# Patient Record
Sex: Female | Born: 1980 | Race: White | Hispanic: No | Marital: Single | Smoking: Never smoker
Health system: Southern US, Community
[De-identification: ages and names within clinical notes are randomized; demographics above are authoritative.]

---
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
## 2009-12-15 ENCOUNTER — Other Ambulatory Visit: Admission: RE | Admit: 2009-12-15 | Discharge: 2009-12-15 | Payer: Self-pay | Admitting: Internal Medicine

## 2011-10-10 DIAGNOSIS — J45990 Exercise induced bronchospasm: Secondary | ICD-10-CM | POA: Insufficient documentation

## 2011-10-10 DIAGNOSIS — J309 Allergic rhinitis, unspecified: Secondary | ICD-10-CM | POA: Insufficient documentation

## 2013-05-01 DIAGNOSIS — M79673 Pain in unspecified foot: Secondary | ICD-10-CM | POA: Insufficient documentation

## 2021-04-11 ENCOUNTER — Other Ambulatory Visit: Payer: Self-pay | Admitting: Internal Medicine

## 2021-04-11 DIAGNOSIS — E7800 Pure hypercholesterolemia, unspecified: Secondary | ICD-10-CM

## 2021-05-30 ENCOUNTER — Ambulatory Visit
Admission: RE | Admit: 2021-05-30 | Discharge: 2021-05-30 | Disposition: A | Discharge status: Home / Self Care | Payer: No Typology Code available for payment source | Source: Ambulatory Visit | Attending: Internal Medicine | Admitting: Internal Medicine

## 2021-05-30 DIAGNOSIS — E7800 Pure hypercholesterolemia, unspecified: Secondary | ICD-10-CM

## 2022-01-31 ENCOUNTER — Ambulatory Visit: Payer: BC Managed Care – PPO | Admitting: Neurology

## 2022-01-31 VITALS — BP 138/92 | HR 73 | Wt 226.0 lb

## 2022-01-31 DIAGNOSIS — R030 Elevated blood-pressure reading, without diagnosis of hypertension: Secondary | ICD-10-CM | POA: Insufficient documentation

## 2022-01-31 DIAGNOSIS — R202 Paresthesia of skin: Secondary | ICD-10-CM | POA: Diagnosis not present

## 2022-01-31 DIAGNOSIS — R52 Pain, unspecified: Secondary | ICD-10-CM | POA: Insufficient documentation

## 2022-01-31 DIAGNOSIS — Z6839 Body mass index (BMI) 39.0-39.9, adult: Secondary | ICD-10-CM | POA: Insufficient documentation

## 2022-01-31 DIAGNOSIS — I2510 Atherosclerotic heart disease of native coronary artery without angina pectoris: Secondary | ICD-10-CM | POA: Insufficient documentation

## 2022-01-31 DIAGNOSIS — R0683 Snoring: Secondary | ICD-10-CM | POA: Diagnosis not present

## 2022-01-31 DIAGNOSIS — K648 Other hemorrhoids: Secondary | ICD-10-CM | POA: Insufficient documentation

## 2022-01-31 DIAGNOSIS — E559 Vitamin D deficiency, unspecified: Secondary | ICD-10-CM | POA: Insufficient documentation

## 2022-01-31 DIAGNOSIS — Z8709 Personal history of other diseases of the respiratory system: Secondary | ICD-10-CM | POA: Insufficient documentation

## 2022-01-31 NOTE — Progress Notes (Signed)
Chief Complaint  Patient presents with   New Patient (Initial Visit)    Rm 15, alone NP/Paper/[facility]/Katie Arlana Pouch NP [phone] onset numbness and tingling in bilateral arms  Sx started 01/26/22 after Bacterial infection, pt woke up 4 nights in row with tingling in both hands        ASSESSMENT AND PLAN  Rachel Blake is a 41 y.o. female   Intermittent bilateral hands paresthesia,  Only happened during sleep, improved with bilateral wrist splint, suggestive bilateral median compression,  Well-preserved sensory examination, brisk reflex, no evidence of demyelinating neuropathy Diffuse body achy pain  Inflammatory markers, CPK, TSH High risk for obstructive sleep apnea  Overweight, narrow long pharyngeal space, excessive daytime fatigue,  Referred for sleep study   DIAGNOSTIC DATA (LABS, IMAGING, TESTING) - I reviewed patient records, labs, notes, testing and imaging myself where available.   MEDICAL HISTORY:  Rachel Blake is a 41 year old female, seen in request by her primary care doctor Merri Brunette, for evaluation of intermittent bilateral hands paresthesia, snoring, initial evaluation was January 31, 2022  I reviewed and summarized the referring note.PMHX  She suffered significant fever, diarrhea following her [place] trip in early August 2023.  Fever for 1 week, diarrhea for about 2 weeks, required 2 rounds of antibiotic treatment, eventually stool sample PCR confirmed the diagnosis of Campylobacter infection, she has lost more than 20 pounds over 2 weeks been  She began to experience intermittent bilateral hands numbness about a week ago, intermittent, only happened she woke up from sleep, improved after she is shake hand, she denies persistent sensory loss, weakness  She was suggested  wrist splint, and she changed her sleeping position to her back, bilateral hand numbness has not occurred, but she continues to feel upper extremity aching fatigue  sensation  She is overweight, noticed to have short neck, narrow long pharyngeal space, reported a long history of snoring, especially if she lying on her back, improved when she sleep on her stomach  She worries about the complications following Campylobacter infection, especially the possibility of Guillain-Barr syndrome, she denies low back pain, no gait abnormality  Laboratory evaluation from [facility] medical associate January 30, 2022, B12 was low normal at 306, normal folic acid 15.2 Normal CBC hemoglobin of 13.6, platelet was elevated 545, BMP showed creatinine 0.77, normal BUN of 10, normal sodium, potassium, chloride, calcium, lipid panel LDL was mildly elevated 142 vitamin D was mildly decreased 27   PHYSICAL EXAM:   Vitals:   01/31/22 1446  BP: (!) 138/92  Pulse: 73  Weight: 226 lb (102.5 kg)     PHYSICAL EXAMNIATION:  Gen: NAD, conversant, well nourised, well groomed                     Cardiovascular: Regular rate rhythm, no peripheral edema, warm, nontender. Eyes: Conjunctivae clear without exudates or hemorrhage Neck: Supple, no carotid bruits. Pulmonary: Clear to auscultation bilaterally   NEUROLOGICAL EXAM:  MENTAL STATUS: Speech/cognition: Awake, alert, oriented to history taking and casual conversation CRANIAL NERVES: CN II: Visual fields are full to confrontation. Pupils are round equal and briskly reactive to light. CN III, IV, VI: extraocular movement are normal. No ptosis. CN V: Facial sensation is intact to light touch CN VII: Face is symmetric with normal eye closure  CN VIII: Hearing is normal to causal conversation. CN IX, X: Phonation is normal. CN XI: Head turning and shoulder shrug are intact CN XII: Narrow oropharyngeal space  MOTOR:  There is no pronator drift of out-stretched arms. Muscle bulk and tone are normal. Muscle strength is normal.  REFLEXES: Reflexes are 2+ and symmetric at the biceps, triceps, knees, and ankles. Plantar  responses are flexor.  SENSORY: Intact to light touch, pinprick and vibratory sensation are intact in fingers and toes.  COORDINATION: There is no trunk or limb dysmetria noted.  GAIT/STANCE: Posture is normal. Gait is steady with normal steps, base, arm swing, and turning. Heel and toe walking are normal. Tandem gait is normal.  Romberg is absent.  REVIEW OF SYSTEMS:  Full 14 system review of systems performed and notable only for as above All other review of systems were negative.   ALLERGIES: No Known Allergies  HOME MEDICATIONS: Current Outpatient Medications  Medication Sig Dispense Refill   Fexofenadine HCl (ALLEGRA PO)      Probiotic Product (PROBIOTIC-10 PO) Take by mouth.     rosuvastatin (CRESTOR) 20 MG tablet 1 tablet     Vitamin D, Ergocalciferol, (DRISDOL) 1.25 MG (50000 UNIT) CAPS capsule Take by mouth.     No current facility-administered medications for this visit.    PAST MEDICAL HISTORY: No past medical history on file.  PAST SURGICAL HISTORY: None FAMILY HISTORY: No family history on file.  SOCIAL HISTORY: Social History   Socioeconomic History   Marital status: Single    Spouse name: Not on file   Number of children: Not on file   Years of education: Not on file   Highest education level: Not on file  Occupational History   Not on file  Tobacco Use   Smoking status: Not on file   Smokeless tobacco: Not on file  Substance and Sexual Activity   Alcohol use: Not on file   Drug use: Not on file   Sexual activity: Not on file  Other Topics Concern   Not on file  Social History Narrative   Not on file   Social Determinants of Health   Financial Resource Strain: Not on file  Food Insecurity: Not on file  Transportation Needs: Not on file  Physical Activity: Not on file  Stress: Not on file  Social Connections: Not on file  Intimate Partner Violence: Not on file      Levert Feinstein, M.D. Ph.D.  [facility] [address] [place], [place] [address] Ph: [phone] Fax: [phone]  CC:  Linus Galas, NP [address] [place],  [place] [address]  Merri Brunette, MD

## 2022-02-01 LAB — SEDIMENTATION RATE: Sed Rate: 12 mm/hr (ref 0–32)

## 2022-02-01 LAB — CK: Total CK: 56 U/L (ref 32–182)

## 2022-02-01 LAB — TSH: TSH: 0.917 u[IU]/mL (ref 0.450–4.500)

## 2022-02-01 LAB — C-REACTIVE PROTEIN: CRP: 1 mg/L (ref 0–10)

## 2022-02-02 ENCOUNTER — Encounter: Payer: Self-pay | Admitting: Neurology

## 2022-04-19 ENCOUNTER — Institutional Professional Consult (permissible substitution): Payer: BC Managed Care – PPO | Admitting: Neurology

## 2022-08-09 ENCOUNTER — Ambulatory Visit: Payer: BC Managed Care – PPO | Admitting: Neurology

## 2022-09-27 ENCOUNTER — Ambulatory Visit: Payer: BC Managed Care – PPO | Admitting: Neurology

## 2022-09-27 ENCOUNTER — Encounter: Payer: Self-pay | Admitting: Neurology

## 2022-09-27 VITALS — BP 130/88 | HR 77 | Ht 66.5 in | Wt 237.0 lb

## 2022-09-27 DIAGNOSIS — R0683 Snoring: Secondary | ICD-10-CM

## 2022-09-27 DIAGNOSIS — R42 Dizziness and giddiness: Secondary | ICD-10-CM | POA: Insufficient documentation

## 2022-09-27 DIAGNOSIS — R4189 Other symptoms and signs involving cognitive functions and awareness: Secondary | ICD-10-CM | POA: Diagnosis not present

## 2022-09-27 NOTE — Progress Notes (Addendum)
Chief Complaint  Patient presents with   Numbness    Rm 13, alone Parasthesia has went away doesn't want to talk about this topic today  pt stated currently having lightheadedness, brain fog, "feels weird" ongoing for 5 weeks Headaches close to forehead on and off for 4 weeks as well       ASSESSMENT AND PLAN  Rachel Blake is a 42 y.o. 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 Of the laboratory evaluation by Dr. Dione Booze office October 03, 2022, nerves are full with possible blurred drusen, margins are sharp without suggestion of edema, eye exam is not suggestive of papillary edema, will repeat OCT in 3 months for comparison, visual field is full OU  MEDICAL HISTORY:  Rachel Blake is a 42 year old female, seen in request by her primary care doctor Merri Brunette, for evaluation of intermittent bilateral hands paresthesia, snoring, initial evaluation was January 31, 2022  I reviewed and summarized the referring note.PMHX  She suffered significant fever, diarrhea following her [place] trip in early August 2023.  Fever for 1 week, diarrhea for about 2 weeks, required 2 rounds of antibiotic treatment, eventually stool sample PCR confirmed the diagnosis of Campylobacter infection, she has lost more than 20 pounds over 2 weeks been  She began to experience intermittent bilateral hands numbness about a week ago, intermittent, only  happened she woke up from sleep, improved after she is shake hand, she denies persistent sensory loss, weakness  She was suggested  wrist splint, and she changed her sleeping position to her back, bilateral hand numbness has not occurred, but she continues to feel upper extremity aching fatigue sensation  She is overweight, noticed to have short neck, narrow long pharyngeal space, reported a long history of snoring, especially if she lying on her back, improved when she sleep on her stomach  She worries about the complications following Campylobacter infection, especially the possibility of Guillain-Barr syndrome, she denies low back pain, no gait abnormality  Laboratory evaluation from [facility] associate January 30, 2022, B12 was low normal at 306, normal folic acid 15.2 Normal CBC hemoglobin of 13.6, platelet was elevated 545, BMP showed creatinine 0.77, normal BUN of 10, normal sodium, potassium, chloride, calcium, lipid panel LDL was mildly elevated 142 vitamin D was mildly decreased 27  UPDATE September 27 2022: Her paresthesia and whole body tremor has much improved, bilateral hands paresthesia has improved as well, only occasionally numbness when she sleeps in wrist flexion position  She has snoring, occasionally gasping for air spell, denies significant daytime fatigue, excessive sleepiness, over the past few months, she often woke up feel fuzzy, lightheaded, brain fog, sometimes went away after few hours, it can came back, head pressure sensation,  It was intermittent, seems to getting worse since March 2024 in addition, she complains of mild blurry vision, vision is not as sharp  PHYSICAL EXAM:   Vitals:   09/27/22 1326  BP: 130/88  Pulse: 77  Weight: 237  lb (107.5 kg)  Height: 5' 6.5" (1.689 m)     PHYSICAL EXAMNIATION:  Gen: NAD, conversant, well nourised, well groomed                     Cardiovascular: Regular rate rhythm, no peripheral edema, warm, nontender. Eyes:  Conjunctivae clear without exudates or hemorrhage Neck: Supple, no carotid bruits. Pulmonary: Clear to auscultation bilaterally   NEUROLOGICAL EXAM:  MENTAL STATUS: Speech/cognition: Awake, alert, oriented to history taking and casual conversation CRANIAL NERVES: CN II: Visual fields are full to confrontation. Pupils are round equal and briskly reactive to light.  Funduscopy examination showed blurry  bilateral nasal edge CN III, IV, VI: extraocular movement are normal. No ptosis. CN V: Facial sensation is intact to light touch CN VII: Face is symmetric with normal eye closure  CN VIII: Hearing is normal to causal conversation. CN IX, X: Phonation is normal. CN XI: Head turning and shoulder shrug are intact CN XII: Narrow oropharyngeal space  MOTOR: There is no pronator drift of out-stretched arms. Muscle bulk and tone are normal. Muscle strength is normal.  REFLEXES: Reflexes are 1 and symmetric at the biceps, triceps, knees, and ankles. Plantar responses are flexor.  SENSORY: Intact to light touch, pinprick and vibratory sensation are intact in fingers and toes.  COORDINATION: There is no trunk or limb dysmetria noted.  GAIT/STANCE: Posture is normal. Gait is steady with normal steps, base, arm swing, and turning. Heel and toe walking are normal. Tandem gait is normal.  Romberg is absent.  REVIEW OF SYSTEMS:  Full 14 system review of systems performed and notable only for as above All other review of systems were negative.   ALLERGIES: No Known Allergies  HOME MEDICATIONS: Current Outpatient Medications  Medication Sig Dispense Refill   Cyanocobalamin (VITAMIN B 12 PO) Take 1 tablet by mouth daily.     Fexofenadine HCl (ALLEGRA PO)      fluticasone (FLONASE) 50 MCG/ACT nasal spray Place 1 spray into both nostrils daily.     rosuvastatin (CRESTOR) 20 MG tablet 1 tablet     Vitamin D, Ergocalciferol, (DRISDOL) 1.25 MG (50000 UNIT) CAPS capsule Take by mouth.     No  current facility-administered medications for this visit.    PAST MEDICAL HISTORY: History reviewed. No pertinent past medical history.  PAST SURGICAL HISTORY: None FAMILY HISTORY: History reviewed. No pertinent family history.  SOCIAL HISTORY: Social History   Socioeconomic History   Marital status: Single    Spouse name: Not on file   Number of children: 0   Years of education: Not on file   Highest education level: Not on file  Occupational History   Not on file  Tobacco Use   Smoking status: Never   Smokeless tobacco: Never  Substance and Sexual Activity   Alcohol use: Yes    Alcohol/week: 3.0 standard drinks of alcohol    Types: 3 Glasses of wine per week   Drug use: Not Currently   Sexual activity: Not Currently  Other Topics Concern   Not on file  Social History Narrative   Not on file   Social Determinants of Health   Financial Resource Strain: Not on file  Food Insecurity: Not on file  Transportation Needs: Not on file  Physical Activity: Not on file  Stress: Not on file  Social Connections: Not on file  Intimate Partner Violence: Not on file      Levert Feinstein, M.D. Ph.D.  [facility]  [address] [place], [place] [address] Ph: [phone] Fax: [phone]  CC:  Merri Brunette, MD [address] [place],  [place] [address]  Merri Brunette, MD

## 2022-10-01 ENCOUNTER — Telehealth: Payer: Self-pay | Admitting: Neurology

## 2022-10-01 NOTE — Telephone Encounter (Signed)
Opthalmology referral faxed to [facility] (fax#[phone], phone# [phone]) 

## 2022-10-03 ENCOUNTER — Ambulatory Visit (INDEPENDENT_AMBULATORY_CARE_PROVIDER_SITE_OTHER): Payer: BC Managed Care – PPO

## 2022-10-03 DIAGNOSIS — R4189 Other symptoms and signs involving cognitive functions and awareness: Secondary | ICD-10-CM | POA: Diagnosis not present

## 2022-10-03 DIAGNOSIS — R0683 Snoring: Secondary | ICD-10-CM | POA: Diagnosis not present

## 2022-10-03 DIAGNOSIS — R42 Dizziness and giddiness: Secondary | ICD-10-CM

## 2022-10-04 ENCOUNTER — Encounter: Payer: Self-pay | Admitting: Neurology

## 2022-11-15 ENCOUNTER — Institutional Professional Consult (permissible substitution): Payer: BC Managed Care – PPO | Admitting: Neurology

## 2022-12-04 ENCOUNTER — Institutional Professional Consult (permissible substitution): Payer: BC Managed Care – PPO | Admitting: Neurology

## 2023-01-18 IMAGING — CT CT CARDIAC CORONARY ARTERY CALCIUM SCORE
3 series · 14 of 20 positions shown, 16 images · non-contrast
Comparison: None.

CLINICAL DATA: High cholesterol, family history

EXAM:
CT CARDIAC CORONARY ARTERY CALCIUM SCORE
TECHNIQUE: Non-contrast imaging through the heart was performed using
prospective ECG gating. Image post processing was performed on an
independent workstation, allowing for quantitative analysis of the
heart and coronary arteries. Note that this exam targets the heart
and the chest was not imaged in its entirety.

[Series 2: calcium scoring 2.00 qr36 bestdiast 69% hrt calciu · axial · 0.40mm/px · z∈[+1385,+1469]mm · 4 of 70 slices shown]
[im 14/70  vessel]
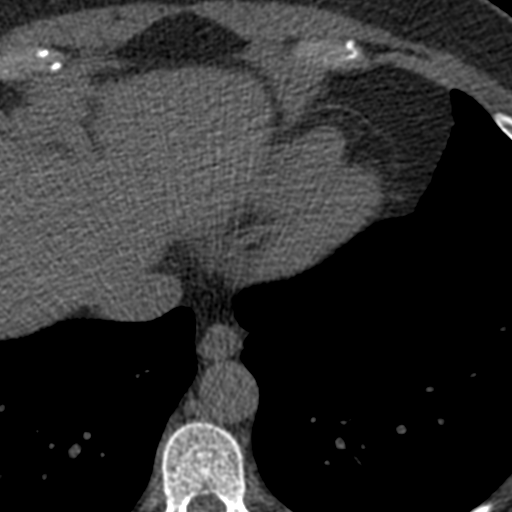
[im 28/70  vessel]
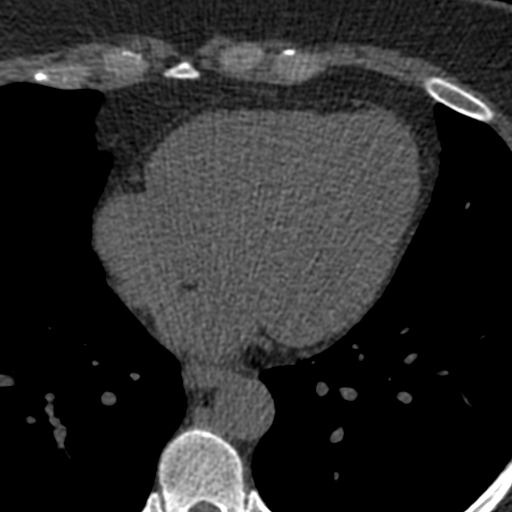
[im 42/70  vessel]
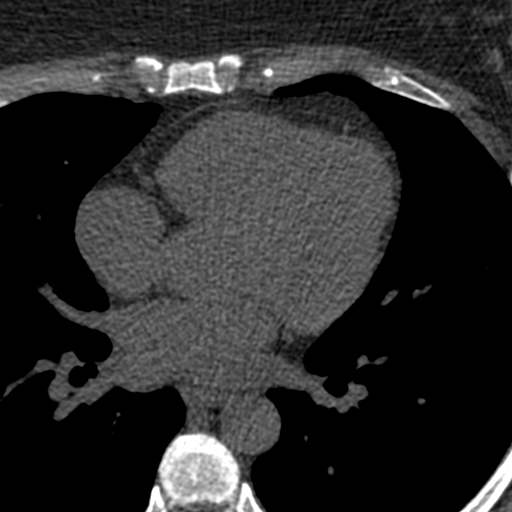
[im 56/70  vessel]
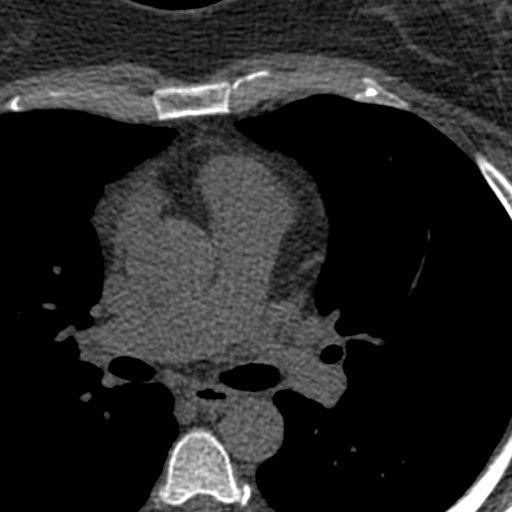

[Series 3: calcium scoring 2.00 br40 bestdiast 69% axial · axial · 0.40mm/px · z∈[+1381,+1473]mm · 5 of 70 slices shown, 7 images]
[im 12/70  vessel]
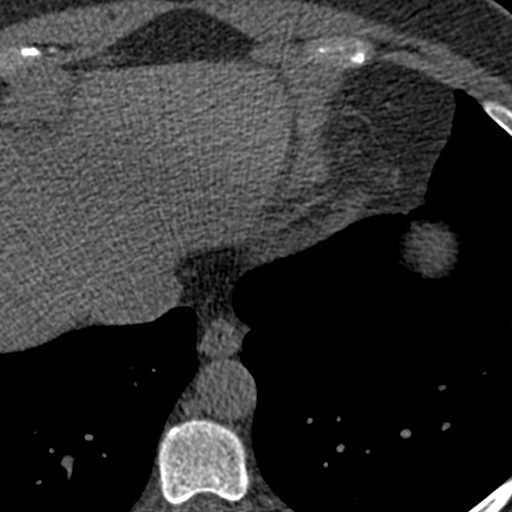
[im 12/70  lung]
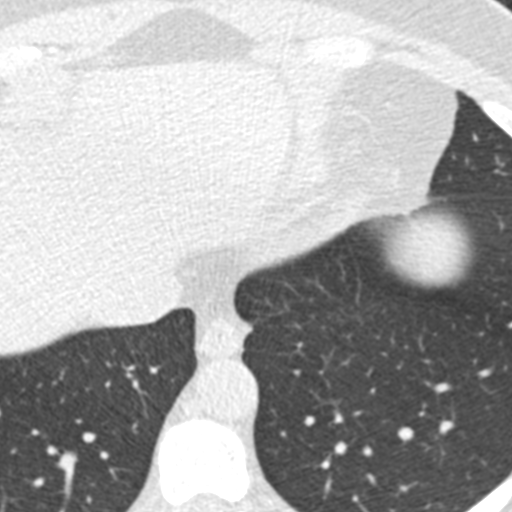
[im 24/70  vessel]
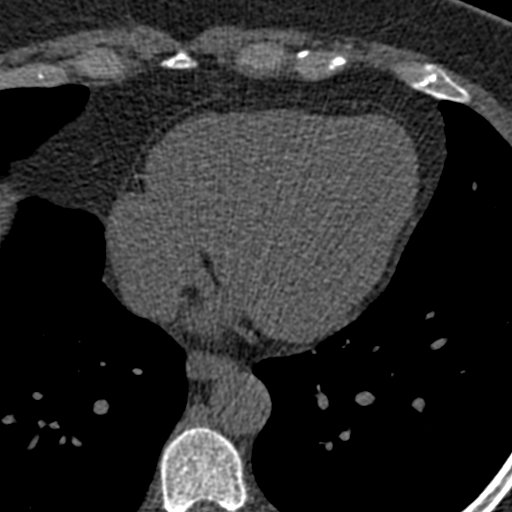
[im 35/70  vessel]
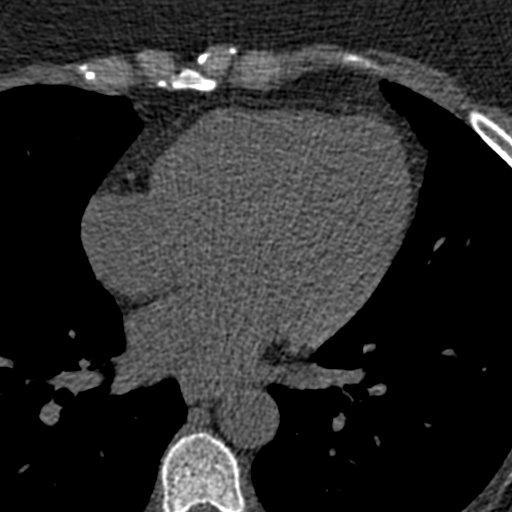
[im 47/70  vessel]
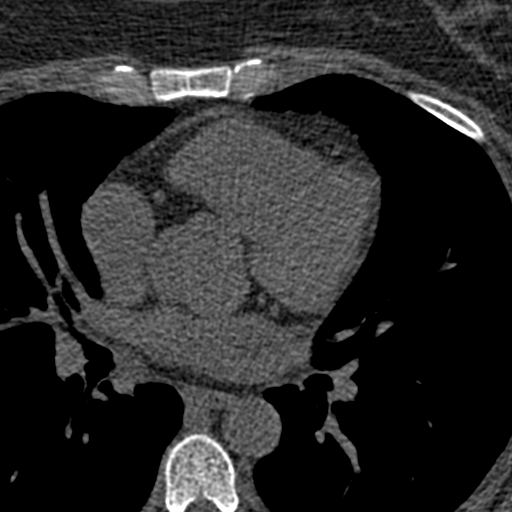
[im 58/70  vessel]
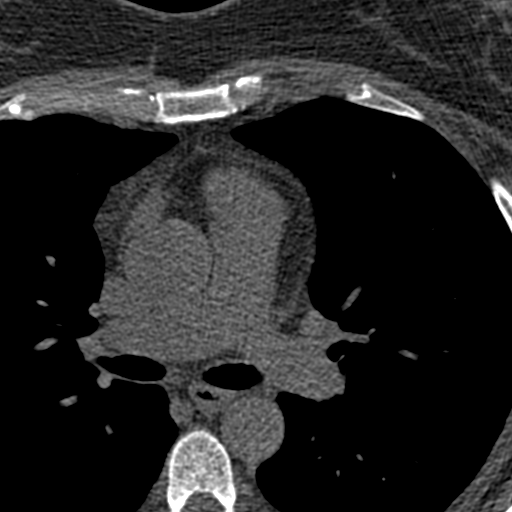
[im 58/70  lung]
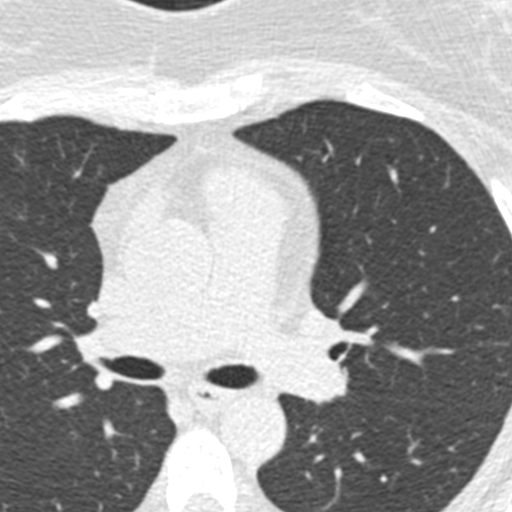

[Series 9: calcium scoring 2.00 br60 bestdiast 69% lungs · axial · 0.40mm/px · z∈[+1381,+1473]mm · 5 of 70 slices shown]
[im 12/70  vessel]
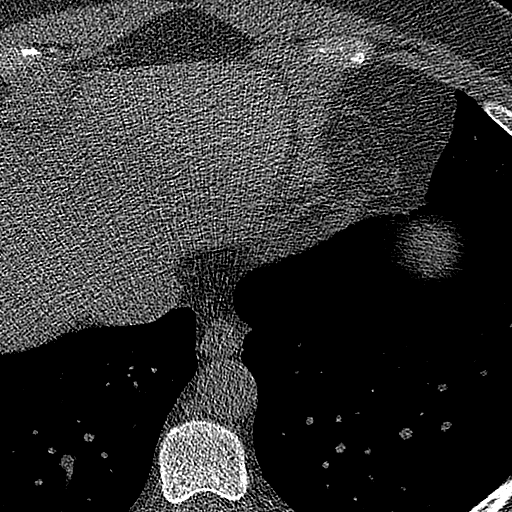
[im 24/70  vessel]
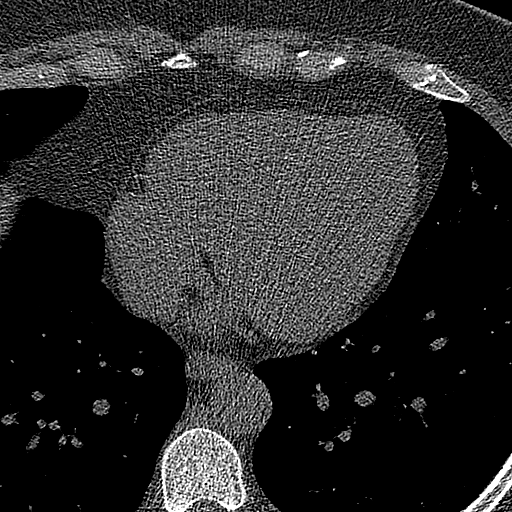
[im 35/70  vessel]
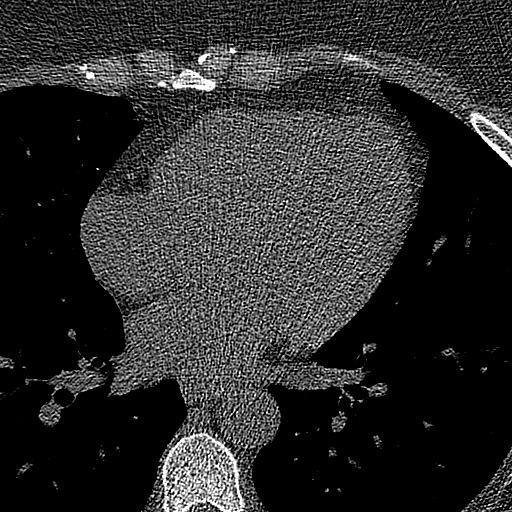
[im 47/70  vessel]
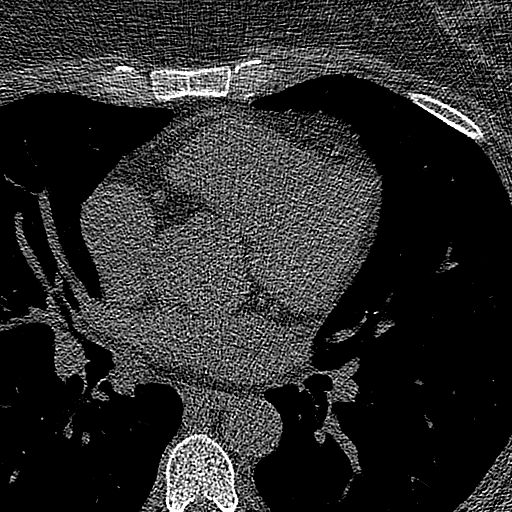
[im 58/70  vessel]
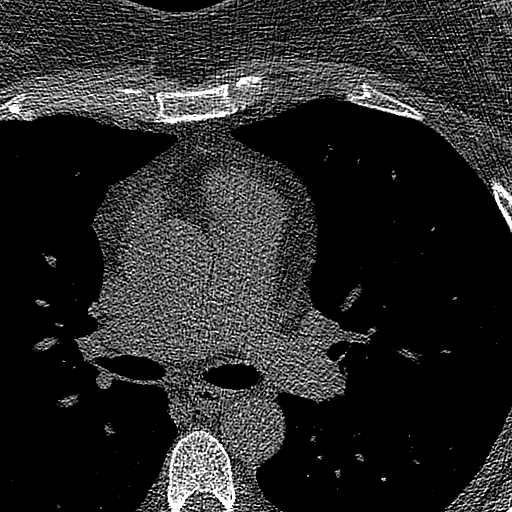

[14 of 20 positions shown; findings below may reference images not displayed]

FINDINGS: CORONARY CALCIUM SCORES:

Left Main: 0

LAD:

LCx: 0

RCA: 0

Total Agatston Score:

[HOSPITAL] percentile: 99. Note, this is compared to 45 year
olds, the youngest age in the [HOSPITAL]

AORTA MEASUREMENTS:

Ascending Aorta: 34 mm

Descending Aorta: 24 mm

OTHER FINDINGS:

Heart is normal size. Punctate calcification in the aortic arch. No
aneurysm. Scattered calcified left hilar and mediastinal lymph
nodes. Visualized lungs clear. No effusions. Imaging into the upper
abdomen demonstrates no acute findings. Chest wall soft tissues are
unremarkable. No acute bony abnormality.
IMPRESSION: Total Agatston score:

[HOSPITAL] percentile: 99 when compared to 45 year olds, the
youngest age in the [HOSPITAL].

Punctate calcification in the aortic arch.

Old granulomatous disease.

## 2023-04-09 ENCOUNTER — Telehealth: Payer: Self-pay | Admitting: Neurology

## 2023-04-09 NOTE — Telephone Encounter (Signed)
Pt cancelled appointment due to having a meeting at work and will call back to reschedule.

## 2023-04-11 ENCOUNTER — Ambulatory Visit: Payer: BC Managed Care – PPO | Admitting: Neurology
